# Patient Record
Sex: Female | Born: 1940 | Race: Black or African American | Hispanic: No | Marital: Married | Smoking: Never smoker
Health system: Southern US, Community
[De-identification: ages and names within clinical notes are randomized; demographics above are authoritative.]

## PROBLEM LIST (undated history)

## (undated) DIAGNOSIS — M1991 Primary osteoarthritis, unspecified site: Secondary | ICD-10-CM

## (undated) DIAGNOSIS — E669 Obesity, unspecified: Secondary | ICD-10-CM

## (undated) DIAGNOSIS — K219 Gastro-esophageal reflux disease without esophagitis: Secondary | ICD-10-CM

## (undated) DIAGNOSIS — E1165 Type 2 diabetes mellitus with hyperglycemia: Secondary | ICD-10-CM

## (undated) DIAGNOSIS — N321 Vesicointestinal fistula: Secondary | ICD-10-CM

## (undated) DIAGNOSIS — IMO0001 Reserved for inherently not codable concepts without codable children: Secondary | ICD-10-CM

## (undated) DIAGNOSIS — I517 Cardiomegaly: Secondary | ICD-10-CM

## (undated) DIAGNOSIS — E782 Mixed hyperlipidemia: Secondary | ICD-10-CM

## (undated) DIAGNOSIS — E0500 Thyrotoxicosis with diffuse goiter without thyrotoxic crisis or storm: Secondary | ICD-10-CM

## (undated) DIAGNOSIS — Z8719 Personal history of other diseases of the digestive system: Secondary | ICD-10-CM

## (undated) HISTORY — PX: CYSTOURETHROSCOPY: SHX476

## (undated) HISTORY — PX: APPENDECTOMY: SHX54

## (undated) HISTORY — PX: TUBAL LIGATION: SHX77

## (undated) HISTORY — PX: GLAUCOMA SURGERY: SHX656

## (undated) HISTORY — PX: PROCTOSIGMOIDOSCOPY: SUR1052

## (undated) HISTORY — PX: TONSILLECTOMY: SUR1361

## (undated) HISTORY — PX: CHOLECYSTECTOMY: SHX55

## (undated) HISTORY — PX: COLECTOMY: SHX59

## (undated) HISTORY — PX: CATARACT EXTRACTION, BILATERAL: SHX1313

---

## 2015-10-22 HISTORY — PX: CORNEAL TRANSPLANT: SHX108

## 2018-07-08 ENCOUNTER — Ambulatory Visit
Admission: EM | Admit: 2018-07-08 | Discharge: 2018-07-08 | Disposition: A | Discharge status: Home / Self Care | Payer: Medicare HMO | Attending: Family Medicine | Admitting: Family Medicine

## 2018-07-08 ENCOUNTER — Ambulatory Visit (INDEPENDENT_AMBULATORY_CARE_PROVIDER_SITE_OTHER): Payer: Medicare HMO

## 2018-07-08 ENCOUNTER — Other Ambulatory Visit: Payer: Self-pay

## 2018-07-08 DIAGNOSIS — M791 Myalgia, unspecified site: Secondary | ICD-10-CM

## 2018-07-08 DIAGNOSIS — R6883 Chills (without fever): Secondary | ICD-10-CM | POA: Diagnosis not present

## 2018-07-08 DIAGNOSIS — R05 Cough: Secondary | ICD-10-CM

## 2018-07-08 DIAGNOSIS — B349 Viral infection, unspecified: Secondary | ICD-10-CM | POA: Insufficient documentation

## 2018-07-08 HISTORY — DX: Vesicointestinal fistula: N32.1

## 2018-07-08 HISTORY — DX: Type 2 diabetes mellitus with hyperglycemia: E11.65

## 2018-07-08 HISTORY — DX: Reserved for inherently not codable concepts without codable children: IMO0001

## 2018-07-08 HISTORY — DX: Personal history of other diseases of the digestive system: Z87.19

## 2018-07-08 HISTORY — DX: Mixed hyperlipidemia: E78.2

## 2018-07-08 HISTORY — DX: Thyrotoxicosis with diffuse goiter without thyrotoxic crisis or storm: E05.00

## 2018-07-08 HISTORY — DX: Gastro-esophageal reflux disease without esophagitis: K21.9

## 2018-07-08 HISTORY — DX: Cardiomegaly: I51.7

## 2018-07-08 HISTORY — DX: Primary osteoarthritis, unspecified site: M19.91

## 2018-07-08 HISTORY — DX: Obesity, unspecified: E66.9

## 2018-07-08 LAB — RAPID INFLUENZA A&B ANTIGENS
Influenza A (ARMC): NEGATIVE
Influenza B (ARMC): NEGATIVE

## 2018-07-08 MED ORDER — IPRATROPIUM BROMIDE 0.06 % NA SOLN: 2.0000 | Freq: Four times a day (QID) | NASAL | 0 refills | Status: AC | PRN

## 2018-07-08 NOTE — Discharge Instructions (Signed)
Medication as prescribed.  Flu negative. Chest xray negative.  Take care  Dr. Adriana Simas

## 2018-07-08 NOTE — ED Provider Notes (Signed)
[facility]    CSN: [id number] Arrival date & time: 07/08/18  1341  History   Chief Complaint Chief Complaint  Patient presents with  . Generalized Body Aches   HPI  78 year old female presents with the above complaints.  Patient states that she has not felt well for the past 2 weeks.  Started with cough and then developed sneezing.  These symptoms are now resolved.  She now reports that she has had "sniffles" and severe body aches over the past 2 days.  She has had chills.  No documented fever.  She has taken Aleve with some improvement in her body aches.  No known exacerbating factors.  No other reported symptoms.  No other complaints or concerns.  PMH, Surgical Hx, Family Hx, Social History reviewed and updated as below.  Past Medical History:  Diagnosis Date  . CVF (colovesical fistula)   . GERD (gastroesophageal reflux disease)   . Graves disease   . History of diverticulitis   . LVH (left ventricular hypertrophy)   . Mixed hyperlipidemia   . Obesity   . Primary osteoarthritis   . Uncontrolled diabetes mellitus type 2 without complications [facility](HCC)     Past Surgical History:  Procedure Laterality Date  . APPENDECTOMY    . CATARACT EXTRACTION, BILATERAL    . CHOLECYSTECTOMY    . COLECTOMY    . 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 CYSTOURETHROSCOPY    . GLAUCOMA SURGERY    . PROCTOSIGMOIDOSCOPY    . TONSILLECTOMY    . TUBAL LIGATION      OB History   No obstetric history on file.      Home Medications    Prior to Admission medications   Medication Sig Start Date End Date Taking? Authorizing Provider  aspirin 325 MG EC tablet Take 325 mg by mouth daily.   Yes [provider]  atorvastatin (LIPITOR) 80 MG tablet atorvastatin 80 mg tablet 10/14/17  Yes [provider]  brimonidine-timolol (COMBIGAN) 0.2-0.5 % ophthalmic solution Apply to eye. 02/02/18 02/02/19 Yes [provider]  colestipol (COLESTID) 1 g tablet colestipol 1  gram tablet 05/23/14  Yes [provider]  Difluprednate (DUREZOL) 0.05 % EMUL Durezol 0.05 % eye drops   Yes [provider]  Ginkgo Biloba 40 MG TABS Take by mouth.   Yes [provider]  glimepiride (AMARYL) 4 MG tablet Take by mouth. 02/21/18  Yes [provider]  Insulin Degludec (TRESIBA FLEXTOUCH) 200 UNIT/ML SOPN Tresiba FlexTouch U-200 insulin 200 unit/mL (3 mL) subcutaneous pen 02/25/18  Yes [provider]  insulin lispro (HUMALOG KWIKPEN) 100 UNIT/ML KwikPen Inject into the skin. 11/05/16  Yes [provider]  methimazole (TAPAZOLE) 10 MG tablet methimazole 10 mg tablet 12/13/17  Yes [provider]  omeprazole (PRILOSEC) 20 MG capsule Take 20 mg by mouth daily.   Yes [provider]  prednisoLONE acetate (PRED FORTE) 1 % ophthalmic suspension prednisolone acetate 1 % eye drops,suspension   Yes [provider]  ramipril (ALTACE) 10 MG capsule ramipril 10 mg capsule 10/14/17  Yes [provider]  triamterene-hydrochlorothiazide (MAXZIDE-25) 37.5-25 MG tablet Take 1 tablet by mouth daily.   Yes [provider]  verapamil (CALAN-SR) 240 MG CR tablet verapamil ER (SR) 240 mg tablet,extended release 10/14/17  Yes [provider]  ipratropium (ATROVENT) 0.06 % nasal spray Place 2 sprays into both nostrils 4 (four) times daily as needed for rhinitis. 07/08/18   Tommie Samsook, Haniya Fern G, DO  Family History Family History  Problem Relation Age of Onset  . Heart disease Mother   . Diabetes Father     Social History Social History   Tobacco Use  . Smoking status: Never Smoker  . Smokeless tobacco: Never Used  Substance Use Topics  . Alcohol use: Yes    Comment: occasionally  . Drug use: Not Currently     Allergies   Shellfish allergy   Review of Systems Review of Systems  Constitutional: Positive for chills.  HENT:       "sniffles"  Respiratory: Positive for cough.     Musculoskeletal:       Body aches.   Physical Exam Triage Vital Signs ED Triage Vitals  Enc Vitals Group     BP 07/08/18 1412 119/69     Pulse Rate 07/08/18 1412 64     Resp 07/08/18 1412 16     Temp 07/08/18 1412 99.2 F (37.3 C)     Temp Source 07/08/18 1412 Oral     SpO2 07/08/18 1412 94 %     Weight 07/08/18 1402 222 lb (100.7 kg)     Height 07/08/18 1402 5\' 5"  (1.651 m)     Head Circumference --      Peak Flow --      Pain Score 07/08/18 1401 8     Pain Loc --      Pain Edu? --      Excl. in GC? --    Updated Vital Signs BP 119/69 (BP Location: Right Arm)   Pulse 64   Temp 99.2 F (37.3 C) (Oral)   Resp 16   Ht 5\' 5"  (1.651 m)   Wt 100.7 kg   SpO2 94%   BMI 36.94 kg/m   Visual Acuity Right Eye Distance:   Left Eye Distance:   Bilateral Distance:    Right Eye Near:   Left Eye Near:    Bilateral Near:     Physical Exam Vitals signs and nursing note reviewed.  Constitutional:      General: She is not in acute distress. HENT:     Head: Normocephalic and atraumatic.     Right Ear: Tympanic membrane normal.     Left Ear: Tympanic membrane normal.     Nose: Nose normal.     Mouth/Throat:     Pharynx: Oropharynx is clear. No posterior oropharyngeal erythema.  Eyes:     General: No scleral icterus.    Conjunctiva/sclera: Conjunctivae normal.  Cardiovascular:     Rate and Rhythm: Normal rate and regular rhythm.  Pulmonary:     Effort: Pulmonary effort is normal. No respiratory distress.     Breath sounds: No wheezing or rales.  Neurological:     Mental Status: She is alert.  Psychiatric:        Mood and Affect: Mood normal.        Behavior: Behavior normal.    UC Treatments / Results  Labs (all labs ordered are listed, but only abnormal results are displayed) Labs Reviewed  RAPID INFLUENZA A&B ANTIGENS ([facility] ONLY)    EKG None  Radiology Dg Chest 2 View  Result Date: 07/08/2018 CLINICAL DATA:  Cough EXAM: CHEST - 2 VIEW COMPARISON:  None.  FINDINGS: Heart size and vascularity normal. Negative for heart failure or pneumonia. No effusion. Moderately severe dextroscoliosis midthoracic spine. IMPRESSION: No active cardiopulmonary disease. Electronically Signed   By: Marlan Palau M.D.   On: 07/08/2018 15:04    Procedures Procedures (including  critical care time)  Medications Ordered in UC Medications - No data to display  Initial Impression / Assessment and Plan / UC Course  I have reviewed the triage vital signs and the nursing notes.  Pertinent labs & imaging results that were available during my care of the patient were reviewed by me and considered in my medical decision making (see chart for details).    78 year old female presents with respiratory symptoms and body aches.  Flu negative.  X-ray negative.  Treating with Atrovent nasal spray.  Supportive care.  Final Clinical Impressions(s) / UC Diagnoses   Final diagnoses:  Viral illness     Discharge Instructions     Medication as prescribed.  Flu negative. Chest xray negative.  Take care  Dr. Adriana Simas    ED Prescriptions    Medication Sig Dispense Auth. Provider   ipratropium (ATROVENT) 0.06 % nasal spray Place 2 sprays into both nostrils 4 (four) times daily as needed for rhinitis. 15 mL Tommie Sams, DO     Controlled Substance Prescriptions [place] Controlled Substance Registry consulted? Not Applicable   Tommie Sams, DO 07/08/18 1705

## 2018-07-08 NOTE — ED Triage Notes (Signed)
Patient complains of body aches and sniffles, and sweats x 2 days. Reports that she was "cold, unable to get warm over last few days."

## 2019-07-09 IMAGING — CR DG CHEST 2V
2 series · 2 of 2 positions shown · non-contrast
Comparison: None.

CLINICAL DATA: Cough

EXAM:
CHEST - 2 VIEW

[chest pa]
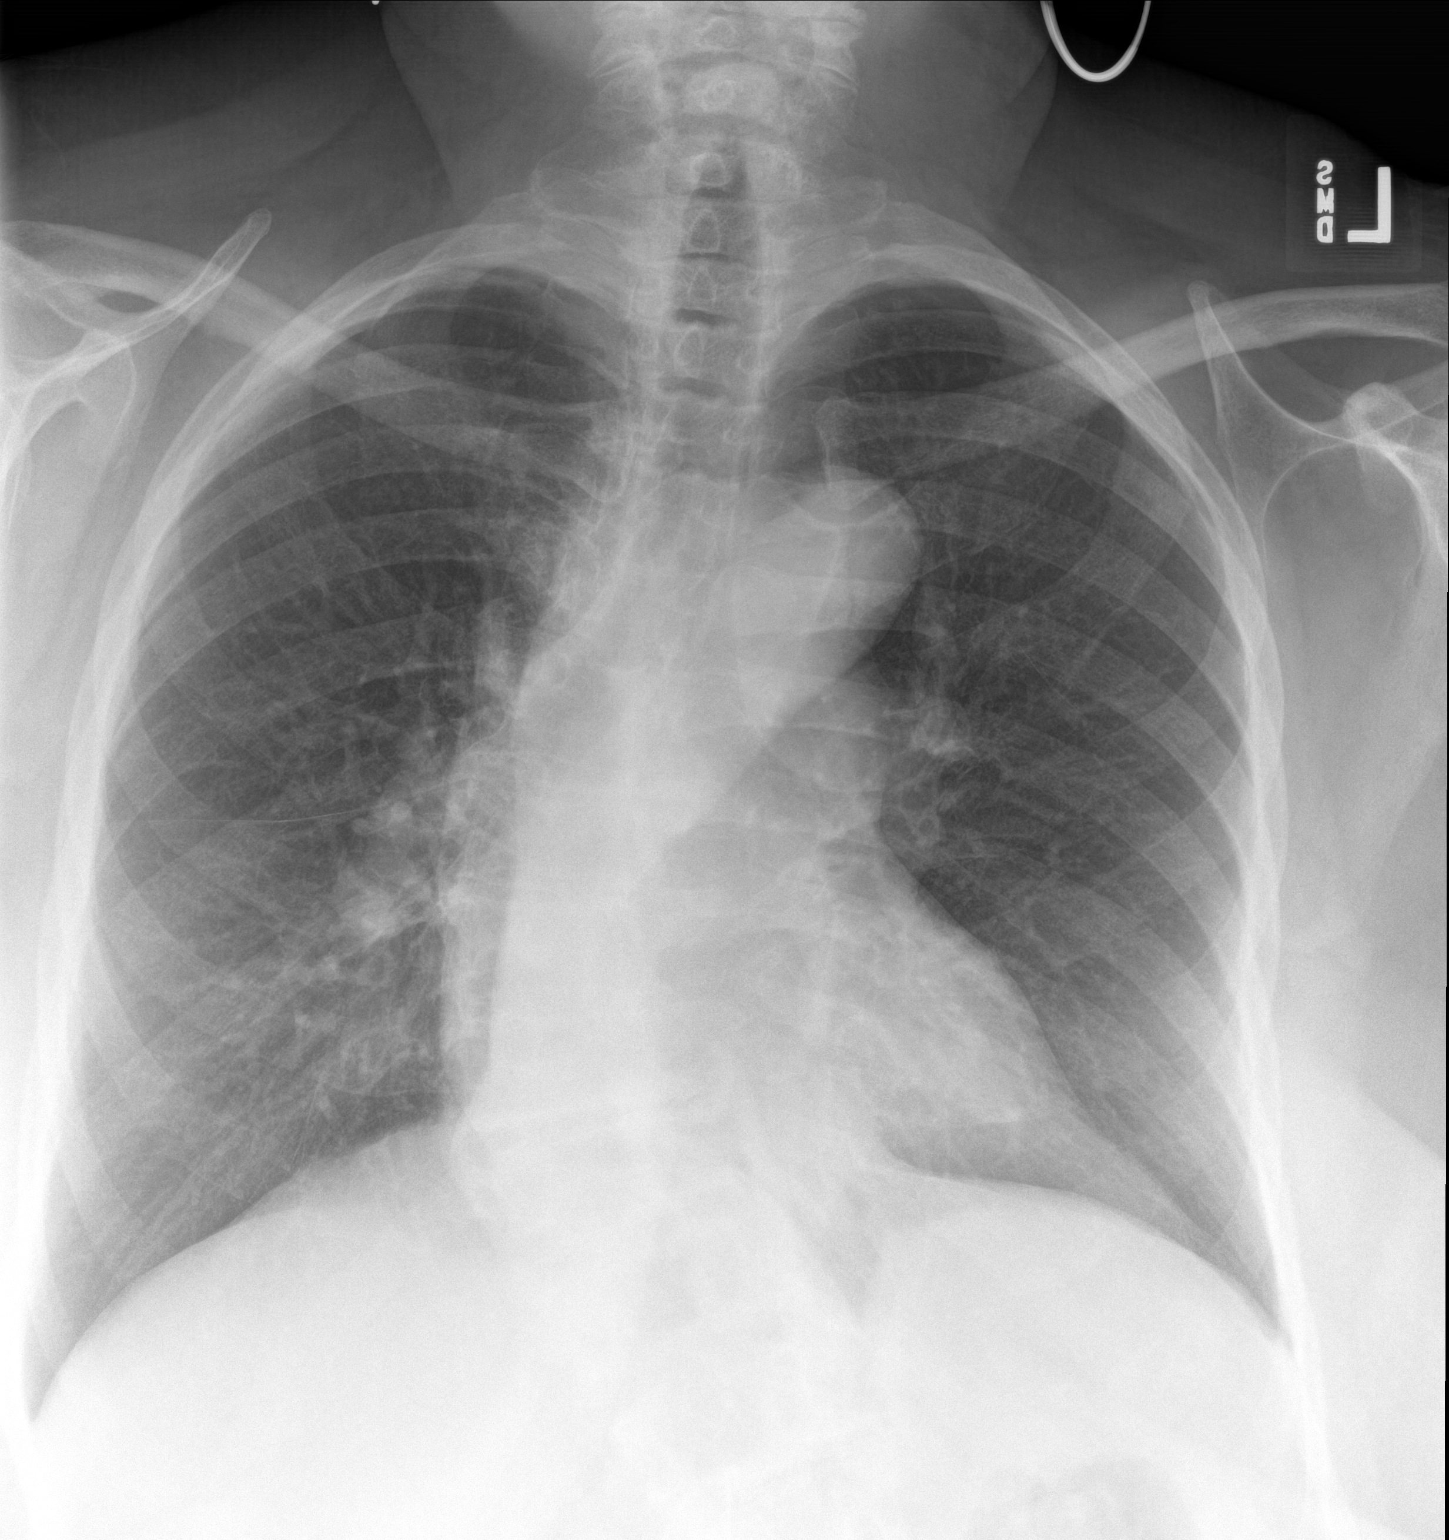

[chest lat]
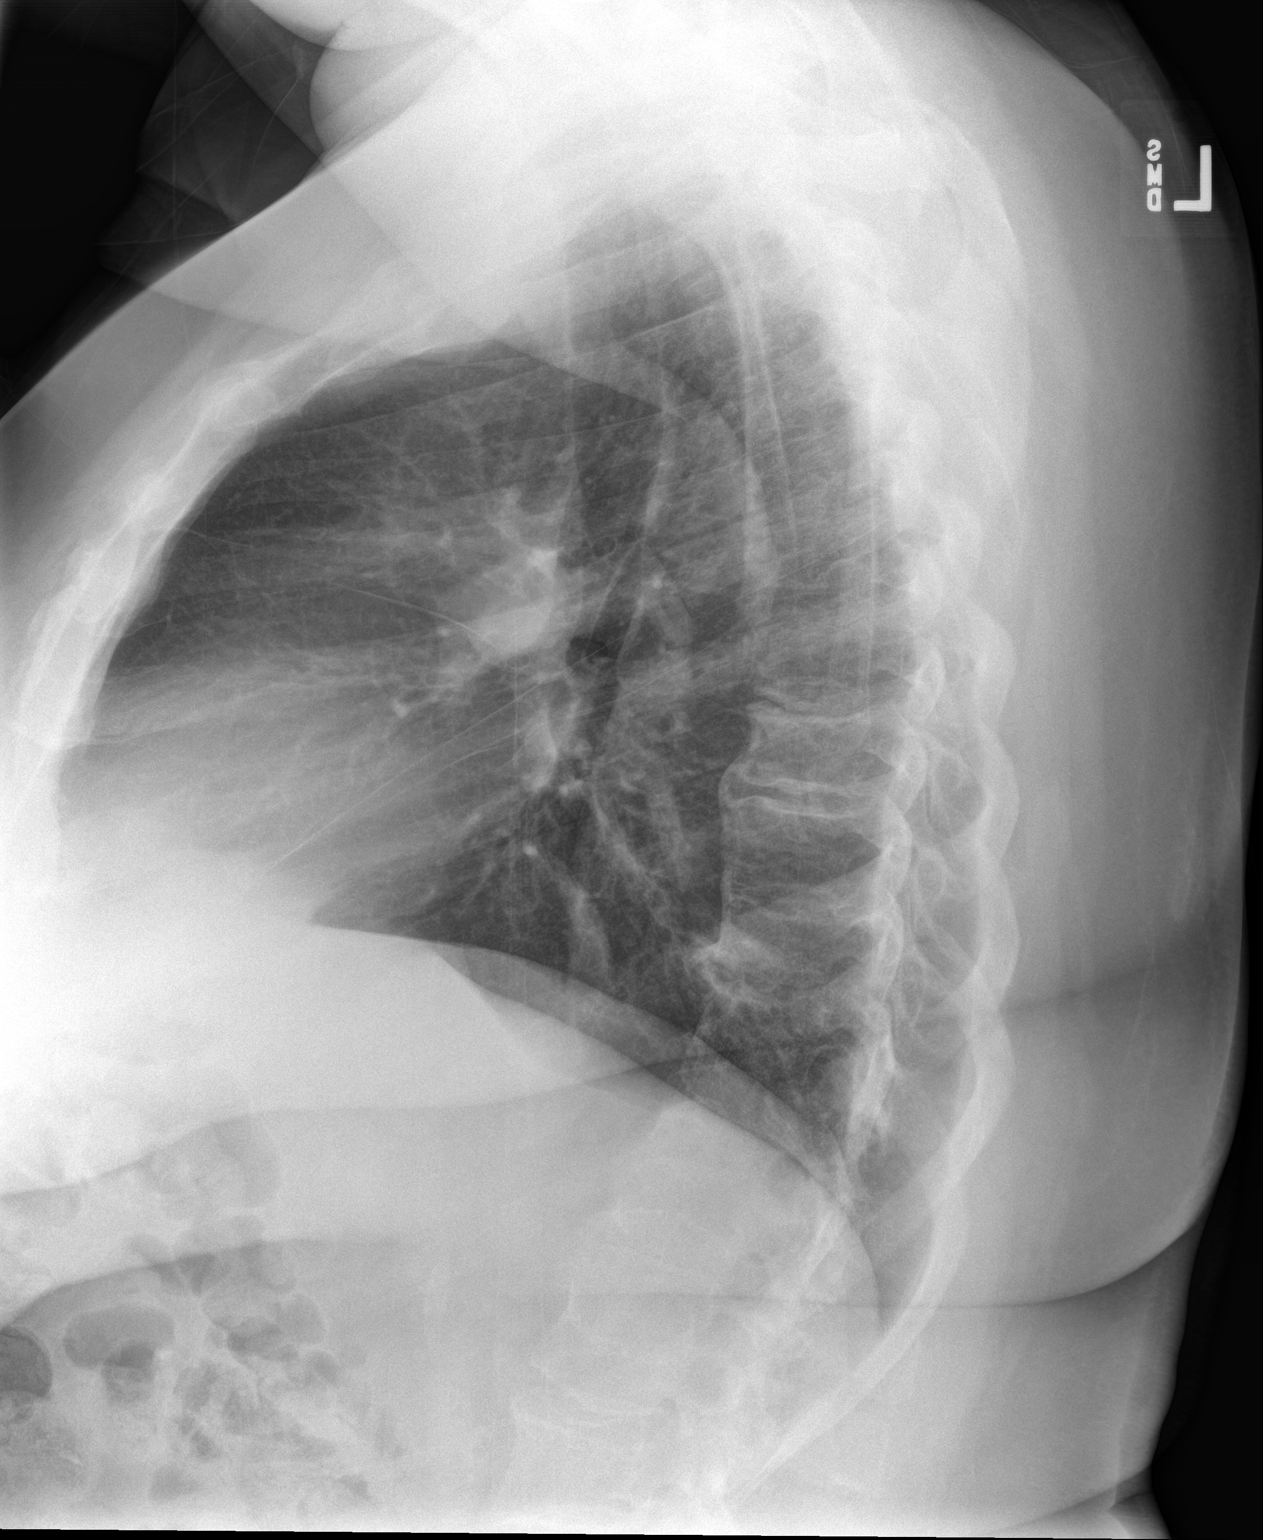

[2 of 2 positions shown; findings below may reference images not displayed]

FINDINGS: Heart size and vascularity normal. Negative for heart failure or
pneumonia. No effusion.

Moderately severe dextroscoliosis midthoracic spine.
IMPRESSION: No active cardiopulmonary disease.
